# Patient Record
Sex: Male | Born: 1960 | Hispanic: Yes | Marital: Single | Smoking: Never smoker
Health system: Southern US, Community
[De-identification: ages and names within clinical notes are randomized; demographics above are authoritative.]

## PROBLEM LIST (undated history)

## (undated) DIAGNOSIS — R739 Hyperglycemia, unspecified: Secondary | ICD-10-CM

---

## 2015-02-22 ENCOUNTER — Emergency Department (HOSPITAL_BASED_OUTPATIENT_CLINIC_OR_DEPARTMENT_OTHER)
Admission: EM | Admit: 2015-02-22 | Discharge: 2015-02-22 | Disposition: A | Discharge status: Home / Self Care | Payer: PRIVATE HEALTH INSURANCE | Attending: Emergency Medicine | Admitting: Emergency Medicine

## 2015-02-22 ENCOUNTER — Encounter (HOSPITAL_BASED_OUTPATIENT_CLINIC_OR_DEPARTMENT_OTHER): Payer: Self-pay | Admitting: *Deleted

## 2015-02-22 DIAGNOSIS — R5381 Other malaise: Secondary | ICD-10-CM | POA: Diagnosis not present

## 2015-02-22 DIAGNOSIS — R531 Weakness: Secondary | ICD-10-CM | POA: Insufficient documentation

## 2015-02-22 DIAGNOSIS — R42 Dizziness and giddiness: Secondary | ICD-10-CM | POA: Insufficient documentation

## 2015-02-22 HISTORY — DX: Hyperglycemia, unspecified: R73.9

## 2015-02-22 LAB — CBC WITH DIFFERENTIAL/PLATELET
Basophils Absolute: 0 10*3/uL (ref 0.0–0.1)
Basophils Relative: 0 % (ref 0–1)
EOS PCT: 8 % — AB (ref 0–5)
Eosinophils Absolute: 0.5 10*3/uL (ref 0.0–0.7)
HCT: 48.9 % (ref 39.0–52.0)
Hemoglobin: 16.8 g/dL (ref 13.0–17.0)
LYMPHS PCT: 37 % (ref 12–46)
Lymphs Abs: 2.3 10*3/uL (ref 0.7–4.0)
MCH: 31.9 pg (ref 26.0–34.0)
MCHC: 34.4 g/dL (ref 30.0–36.0)
MCV: 92.8 fL (ref 78.0–100.0)
Monocytes Absolute: 0.5 10*3/uL (ref 0.1–1.0)
Monocytes Relative: 8 % (ref 3–12)
NEUTROS PCT: 47 % (ref 43–77)
Neutro Abs: 3 10*3/uL (ref 1.7–7.7)
Platelets: 188 10*3/uL (ref 150–400)
RBC: 5.27 MIL/uL (ref 4.22–5.81)
RDW: 12.1 % (ref 11.5–15.5)
WBC: 6.4 10*3/uL (ref 4.0–10.5)

## 2015-02-22 LAB — URINALYSIS, ROUTINE W REFLEX MICROSCOPIC
BILIRUBIN URINE: NEGATIVE
GLUCOSE, UA: NEGATIVE mg/dL
Hgb urine dipstick: NEGATIVE
Ketones, ur: NEGATIVE mg/dL
LEUKOCYTES UA: NEGATIVE
Nitrite: NEGATIVE
PROTEIN: NEGATIVE mg/dL
Specific Gravity, Urine: 1.027 (ref 1.005–1.030)
Urobilinogen, UA: 0.2 mg/dL (ref 0.0–1.0)
pH: 5.5 (ref 5.0–8.0)

## 2015-02-22 LAB — BASIC METABOLIC PANEL
Anion gap: 7 (ref 5–15)
BUN: 22 mg/dL (ref 6–23)
CALCIUM: 8.8 mg/dL (ref 8.4–10.5)
CO2: 26 mmol/L (ref 19–32)
Chloride: 105 mmol/L (ref 96–112)
Creatinine, Ser: 1.1 mg/dL (ref 0.50–1.35)
GFR calc non Af Amer: 76 mL/min — ABNORMAL LOW (ref >90)
GFR, EST AFRICAN AMERICAN: 88 mL/min — AB (ref >90)
GLUCOSE: 113 mg/dL — AB (ref 70–99)
POTASSIUM: 3.7 mmol/L (ref 3.5–5.1)
Sodium: 138 mmol/L (ref 135–145)

## 2015-02-22 LAB — CBG MONITORING, ED: Glucose-Capillary: 104 mg/dL — ABNORMAL HIGH (ref 70–99)

## 2015-02-22 NOTE — ED Notes (Signed)
Some language barrier. Speaks English. Primary language is Spanish. C/o general weakness. Onset this morning at 0500, woke with "feeling hot, dizzy and breathing fast, which abated and resulted in general weakness", "just weak now". No h/o same. No recent illness. No meds PTA. (Denies: pain, nvd, fever or other sx). Alert, NAD, calm, interactive, resps e/u, no dyspnea noted. VSS.

## 2015-02-22 NOTE — Discharge Instructions (Signed)
Drink plain fluids and get plenty of rest.  Return to the emergency department if you develop high fever, chest pain, difficulty breathing, or other new and concerning symptoms.   Fatigue Fatigue is a feeling of tiredness, lack of energy, lack of motivation, or feeling tired all the time. Having enough rest, good nutrition, and reducing stress will normally reduce fatigue. Consult your caregiver if it persists. The nature of your fatigue will help your caregiver to find out its cause. The treatment is based on the cause.  CAUSES  There are many causes for fatigue. Most of the time, fatigue can be traced to one or more of your habits or routines. Most causes fit into one or more of three general areas. They are: Lifestyle problems  Sleep disturbances.  Overwork.  Physical exertion.  Unhealthy habits.  Poor eating habits or eating disorders.  Alcohol and/or drug use .  Lack of proper nutrition (malnutrition). Psychological problems  Stress and/or anxiety problems.  Depression.  Grief.  Boredom. Medical Problems or Conditions  Anemia.  Pregnancy.  Thyroid gland problems.  Recovery from major surgery.  Continuous pain.  Emphysema or asthma that is not well controlled  Allergic conditions.  Diabetes.  Infections (such as mononucleosis).  Obesity.  Sleep disorders, such as sleep apnea.  Heart failure or other heart-related problems.  Cancer.  Kidney disease.  Liver disease.  Effects of certain medicines such as antihistamines, cough and cold remedies, prescription pain medicines, heart and blood pressure medicines, drugs used for treatment of cancer, and some antidepressants. SYMPTOMS  The symptoms of fatigue include:   Lack of energy.  Lack of drive (motivation).  Drowsiness.  Feeling of indifference to the surroundings. DIAGNOSIS  The details of how you feel help guide your caregiver in finding out what is causing the fatigue. You will be asked  about your present and past health condition. It is important to review all medicines that you take, including prescription and non-prescription items. A thorough exam will be done. You will be questioned about your feelings, habits, and normal lifestyle. Your caregiver may suggest blood tests, urine tests, or other tests to look for common medical causes of fatigue.  TREATMENT  Fatigue is treated by correcting the underlying cause. For example, if you have continuous pain or depression, treating these causes will improve how you feel. Similarly, adjusting the dose of certain medicines will help in reducing fatigue.  HOME CARE INSTRUCTIONS   Try to get the required amount of good sleep every night.  Eat a healthy and nutritious diet, and drink enough water throughout the day.  Practice ways of relaxing (including yoga or meditation).  Exercise regularly.  Make plans to change situations that cause stress. Act on those plans so that stresses decrease over time. Keep your work and personal routine reasonable.  Avoid street drugs and minimize use of alcohol.  Start taking a daily multivitamin after consulting your caregiver. SEEK MEDICAL CARE IF:   You have persistent tiredness, which cannot be accounted for.  You have fever.  You have unintentional weight loss.  You have headaches.  You have disturbed sleep throughout the night.  You are feeling sad.  You have constipation.  You have dry skin.  You have gained weight.  You are taking any new or different medicines that you suspect are causing fatigue.  You are unable to sleep at night.  You develop any unusual swelling of your legs or other parts of your body. SEEK IMMEDIATE MEDICAL CARE  IF:   You are feeling confused.  Your vision is blurred.  You feel faint or pass out.  You develop severe headache.  You develop severe abdominal, pelvic, or back pain.  You develop chest pain, shortness of breath, or an irregular  or fast heartbeat.  You are unable to pass a normal amount of urine.  You develop abnormal bleeding such as bleeding from the rectum or you vomit blood.  You have thoughts about harming yourself or committing suicide.  You are worried that you might harm someone else. MAKE SURE YOU:   Understand these instructions.  Will watch your condition.  Will get help right away if you are not doing well or get worse. Document Released: 08/28/2007 Document Revised: 01/23/2012 Document Reviewed: 03/04/2014 [facility] Patient Information 2015 [place], [place]. This information is not intended to replace advice given to you by your health care provider. Make sure you discuss any questions you have with your health care provider.

## 2015-02-22 NOTE — ED Provider Notes (Signed)
CSN: [id number]     Arrival date & time 02/22/15  0602 History   First MD Initiated Contact with Patient 02/22/15 [phone]     Chief Complaint  Patient presents with  . Weakness     (Consider location/radiation/quality/duration/timing/severity/associated sxs/prior Treatment) HPI Comments: Patient is a 54 year old male who presents for evaluation of weakness, dizziness, and generalized malaise which woke him from sleep this morning. He went to bed last night feeling fine. The symptoms lasted for approximately one hour, then he presents here for evaluation. He denies any chest pain, shortness of breath, palpitations. He denies any fevers or chills. He denies any nausea, vomiting, or diarrhea. He denies any congestion or cough.  Patient is a 54 y.o. male presenting with weakness. The history is provided by the patient.  Weakness This is a new problem. The current episode started 1 to 2 hours ago. The problem occurs constantly. The problem has been gradually improving. Pertinent negatives include no chest pain, no abdominal pain, no headaches and no shortness of breath. Nothing aggravates the symptoms. Nothing relieves the symptoms. He has tried nothing for the symptoms. The treatment provided no relief.    Past Medical History  Diagnosis Date  . Hyperglycemia     "distant, 3 yrs ago", denies DM   History reviewed. No pertinent past surgical history. History reviewed. No pertinent family history. History  Substance Use Topics  . Smoking status: Never Smoker   . Smokeless tobacco: Not on file  . Alcohol Use: No    Review of Systems  Respiratory: Negative for shortness of breath.   Cardiovascular: Negative for chest pain.  Gastrointestinal: Negative for abdominal pain.  Neurological: Positive for weakness. Negative for headaches.  All other systems reviewed and are negative.     Allergies  Review of patient's allergies indicates no known allergies.  Home Medications   Prior to  Admission medications   Not on File   BP 133/81 mmHg  Pulse 74  Temp(Src) 98 F (36.7 C) (Oral)  Resp 16  Ht  (1.727 m)  Wt 167 lb (75.751 kg)  BMI 25.40 kg/m2  SpO2 100% Physical Exam  Constitutional: He is oriented to person, place, and time. He appears well-developed and well-nourished.  HENT:  Head: Normocephalic and atraumatic.  Mouth/Throat: Oropharynx is clear and moist.  Eyes: EOM are normal. Pupils are equal, round, and reactive to light.  Neck: Normal range of motion. Neck supple.  Cardiovascular: Normal rate, regular rhythm and normal heart sounds.   No murmur heard. Pulmonary/Chest: Effort normal. No respiratory distress. He has no wheezes.  Abdominal: Soft. Bowel sounds are normal. He exhibits no distension. There is no tenderness.  Musculoskeletal: Normal range of motion. He exhibits no edema.  Lymphadenopathy:    He has no cervical adenopathy.  Neurological: He is alert and oriented to person, place, and time. No cranial nerve deficit. He exhibits normal muscle tone. Coordination normal.  Skin: Skin is warm and dry. He is not diaphoretic.  Nursing note and vitals reviewed.   ED Course  Procedures (including critical care time) Labs Review Labs Reviewed  CBC WITH DIFFERENTIAL/PLATELET - Abnormal; Notable for the following:    Eosinophils Relative 8 (*)    All other components within normal limits  BASIC METABOLIC PANEL - Abnormal; Notable for the following:    Glucose, Bld 113 (*)    GFR calc non Af Amer 76 (*)    GFR calc Af Amer 88 (*)    All other components  within normal limits  CBG MONITORING, ED - Abnormal; Notable for the following:    Glucose-Capillary 104 (*)    All other components within normal limits  URINALYSIS, ROUTINE W REFLEX MICROSCOPIC    Imaging Review No results found.  ED ECG REPORT   Date: 02/22/2015  Rate: 69  Rhythm: normal sinus rhythm  QRS Axis: normal  Intervals: normal  ST/T Wave abnormalities: normal   Conduction Disutrbances:none  Narrative Interpretation:   Old EKG Reviewed: none available  I have personally reviewed the EKG tracing and agree with the computerized printout as noted.   MDM   Final diagnoses:  None    Patient presents for evaluation of feeling weak. This started this morning approximately an hour before coming here. He woke up this way. His physical examination is unremarkable, vitals are stable, and workup reveals no abnormalities in his electrolytes or urinalysis. His EKG reveals a normal sinus rhythm. I am uncertain as to the exact etiology of his symptoms, however this may be some sort of viral prodrome. I do not feel as though there is any indication for admission and believe he is appropriate for discharge. He does state that he is feeling better, however does feel somewhat weak. He will be discharged with instructions to return if his symptoms significantly worsen or change.    Geoffery Lyonsouglas Maddy Graham, MD 02/22/15 [phone]

## 2015-02-22 NOTE — ED Notes (Signed)
Pt ambulatory to b/r to attempt urine sample. Pt alert, NAD, calm, interactive, resps e/u, speaking in clear complete sentences, steady gait. roommate in w/r.

## 2015-03-20 ENCOUNTER — Telehealth: Payer: Self-pay | Admitting: *Deleted

## 2015-03-20 NOTE — Telephone Encounter (Signed)
Unable to reach patient at time of Pre-Visit Call.  Unable to leave voicemail due to voicemail box not being set up yet.

## 2015-03-23 ENCOUNTER — Encounter: Payer: Self-pay | Admitting: Physician Assistant

## 2015-03-23 ENCOUNTER — Ambulatory Visit (INDEPENDENT_AMBULATORY_CARE_PROVIDER_SITE_OTHER): Payer: PRIVATE HEALTH INSURANCE | Admitting: Physician Assistant

## 2015-03-23 VITALS — BP 102/64 | HR 89 | Temp 98.0°F | Ht 67.0 in | Wt 163.0 lb

## 2015-03-23 DIAGNOSIS — R42 Dizziness and giddiness: Secondary | ICD-10-CM

## 2015-03-23 LAB — URINALYSIS, ROUTINE W REFLEX MICROSCOPIC
BILIRUBIN URINE: NEGATIVE
HGB URINE DIPSTICK: NEGATIVE
KETONES UR: NEGATIVE
Leukocytes, UA: NEGATIVE
Nitrite: NEGATIVE
Specific Gravity, Urine: 1.025 (ref 1.000–1.030)
TOTAL PROTEIN, URINE-UPE24: NEGATIVE
URINE GLUCOSE: NEGATIVE
Urobilinogen, UA: 0.2 (ref 0.0–1.0)
pH: 6 (ref 5.0–8.0)

## 2015-03-23 LAB — CBC
HCT: 48.3 % (ref 39.0–52.0)
HEMOGLOBIN: 16.9 g/dL (ref 13.0–17.0)
MCHC: 35 g/dL (ref 30.0–36.0)
MCV: 92.2 fl (ref 78.0–100.0)
Platelets: 203 10*3/uL (ref 150.0–400.0)
RBC: 5.23 Mil/uL (ref 4.22–5.81)
RDW: 12.5 % (ref 11.5–15.5)
WBC: 6.6 10*3/uL (ref 4.0–10.5)

## 2015-03-23 LAB — COMPREHENSIVE METABOLIC PANEL
ALBUMIN: 4.1 g/dL (ref 3.5–5.2)
ALK PHOS: 115 U/L (ref 39–117)
ALT: 46 U/L (ref 0–53)
AST: 31 U/L (ref 0–37)
BUN: 18 mg/dL (ref 6–23)
CHLORIDE: 105 meq/L (ref 96–112)
CO2: 28 mEq/L (ref 19–32)
Calcium: 9.5 mg/dL (ref 8.4–10.5)
Creatinine, Ser: 0.99 mg/dL (ref 0.40–1.50)
GFR: 84.51 mL/min (ref >60.00)
GLUCOSE: 91 mg/dL (ref 70–99)
Potassium: 4.1 mEq/L (ref 3.5–5.1)
Sodium: 138 mEq/L (ref 135–145)
Total Bilirubin: 0.7 mg/dL (ref 0.2–1.2)
Total Protein: 7.4 g/dL (ref 6.0–8.3)

## 2015-03-23 LAB — TSH: TSH: 1.9 u[IU]/mL (ref 0.35–4.50)

## 2015-03-23 NOTE — Assessment & Plan Note (Signed)
Suspect due to mild dehydration and hot work environment.  Exam good. OVS negative but BP remains at low end of normal.  Will repeat CBC, CMP and obtain TSH and UA. Hydration efforts discussed with patient.  Will also start daily Gatorade.  Follow-up in 1.5-2 weeks.  Alarm signs/symptoms discussed with patient.

## 2015-03-23 NOTE — Progress Notes (Signed)
Pre visit review using our clinic review tool, if applicable. No additional management support is needed unless otherwise documented below in the visit note. 

## 2015-03-23 NOTE — Addendum Note (Signed)
Addended by: Marcelline MatesMARTIN, Ezzie Senat on: 03/23/2015 08:53 AM   Modules accepted: Orders

## 2015-03-23 NOTE — Progress Notes (Signed)
Patient presents to clinic today to establish care and for ER follow-up. Records from ER visit are in the EMR and have been reviewed. Translator present for entirety of visit.  Patient seen in ER on 02/22/15 for an episode of dizziness that occurred upon waking that was associated with fatigue. Workup including EKG and lab panel negative for cause of symptoms.  ER physician felt that symptoms could potentially be related to a viral prodrome. Since ER visit, patient endorses continued episodes of lightheadedness.  Denies true vertigo or dizziness.  States episodes last around 1 hour. Denies chest pain, SOB or palpitations.  Denies nausea or vomiting.  Endorses mild fatigue. Symptoms only occur when standing after prolonged sitting or lying.  States he eats well but only drinks a glass of water during his long work shift.  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  Past Medical History  Diagnosis Date  . Hyperglycemia     "distant, 3 yrs ago", denies DM    No past surgical history on file.  No current outpatient prescriptions on file prior to visit.   No current facility-administered medications on file prior to visit.    No Known Allergies  No family history on file.  History   Social History  . Marital Status: Single    Spouse Name: N/A  . Number of Children: N/A  . Years of Education: N/A   Occupational History  . Not on file.   Social History Main Topics  . Smoking status: Never Smoker   . Smokeless tobacco: Not on file  . Alcohol Use: No  . Drug Use: No  . Sexual Activity: Not on file   Other Topics Concern  . Not on file   Social History Narrative   Review of Systems  Constitutional: Negative for fever and weight loss.  Eyes: Negative for blurred vision.  Respiratory: Negative for sputum production.   Cardiovascular: Negative for chest pain and palpitations.  Gastrointestinal: Negative for nausea and vomiting.  Neurological: Negative for dizziness, loss  of consciousness and headaches.  Psychiatric/Behavioral: Negative for depression, suicidal ideas, hallucinations and substance abuse. The patient is not nervous/anxious.     BP 102/64 mmHg  Pulse 89  Temp(Src) 98 F (36.7 C)  Ht '5\' 7"'  (1.702 m)  Wt 163 lb (73.936 kg)  BMI 25.52 kg/m2  SpO2 98%  Physical Exam  Constitutional: He is oriented to person, place, and time and well-developed, well-nourished, and in no distress.  HENT:  Head: Normocephalic and atraumatic.  Right Ear: External ear normal.  Left Ear: External ear normal.  Nose: Nose normal.  Mouth/Throat: Oropharynx is clear and moist. No oropharyngeal exudate.  TM within normal limits bilaterally.  Eyes: Conjunctivae are normal. Pupils are equal, round, and reactive to light.  Neck: Neck supple. No thyromegaly present.  Cardiovascular: Normal rate, regular rhythm, normal heart sounds and intact distal pulses.   Pulmonary/Chest: Effort normal and breath sounds normal. No respiratory distress. He has no wheezes. He has no rales. He exhibits no tenderness.  Neurological: He is alert and oriented to person, place, and time.  Skin: Skin is warm and dry. No rash noted.  Psychiatric: Affect normal.  Vitals reviewed.   Recent Results (from the past 2160 hour(s))  POC CBG, ED     Status: Abnormal   Collection Time: 02/22/15  6:34 AM  Result Value Ref Range   Glucose-Capillary 104 (H) 70 - 99 mg/dL  CBC with Differential  Status: Abnormal   Collection Time: 02/22/15  6:40 AM  Result Value Ref Range   WBC 6.4 4.0 - 10.5 K/uL   RBC 5.27 4.22 - 5.81 MIL/uL   Hemoglobin 16.8 13.0 - 17.0 g/dL   HCT 48.9 39.0 - 52.0 %   MCV 92.8 78.0 - 100.0 fL   MCH 31.9 26.0 - 34.0 pg   MCHC 34.4 30.0 - 36.0 g/dL   RDW 12.1 11.5 - 15.5 %   Platelets 188 150 - 400 K/uL   Neutrophils Relative % 47 43 - 77 %   Neutro Abs 3.0 1.7 - 7.7 K/uL   Lymphocytes Relative 37 12 - 46 %   Lymphs Abs 2.3 0.7 - 4.0 K/uL   Monocytes Relative 8 3 - 12  %   Monocytes Absolute 0.5 0.1 - 1.0 K/uL   Eosinophils Relative 8 (H) 0 - 5 %   Eosinophils Absolute 0.5 0.0 - 0.7 K/uL   Basophils Relative 0 0 - 1 %   Basophils Absolute 0.0 0.0 - 0.1 K/uL  Basic metabolic panel     Status: Abnormal   Collection Time: 02/22/15  6:40 AM  Result Value Ref Range   Sodium 138 135 - 145 mmol/L   Potassium 3.7 3.5 - 5.1 mmol/L   Chloride 105 96 - 112 mmol/L   CO2 26 19 - 32 mmol/L   Glucose, Bld 113 (H) 70 - 99 mg/dL   BUN 22 6 - 23 mg/dL   Creatinine, Ser 1.10 0.50 - 1.35 mg/dL   Calcium 8.8 8.4 - 10.5 mg/dL   GFR calc non Af Amer 76 (L) >90 mL/min   GFR calc Af Amer 88 (L) >90 mL/min    Comment: (NOTE) The eGFR has been calculated using the CKD EPI equation. This calculation has not been validated in all clinical situations. eGFR's persistently <90 mL/min signify possible Chronic Kidney Disease.    Anion gap 7 5 - 15  Urinalysis, Routine w reflex microscopic     Status: None   Collection Time: 02/22/15  6:51 AM  Result Value Ref Range   Color, Urine YELLOW YELLOW   APPearance CLEAR CLEAR   Specific Gravity, Urine 1.027 1.005 - 1.030   pH 5.5 5.0 - 8.0   Glucose, UA NEGATIVE NEGATIVE mg/dL   Hgb urine dipstick NEGATIVE NEGATIVE   Bilirubin Urine NEGATIVE NEGATIVE   Ketones, ur NEGATIVE NEGATIVE mg/dL   Protein, ur NEGATIVE NEGATIVE mg/dL   Urobilinogen, UA 0.2 0.0 - 1.0 mg/dL   Nitrite NEGATIVE NEGATIVE   Leukocytes, UA NEGATIVE NEGATIVE    Comment: MICROSCOPIC NOT DONE ON URINES WITH NEGATIVE PROTEIN, BLOOD, LEUKOCYTES, NITRITE, OR GLUCOSE <1000 mg/dL.    Assessment/Plan: Episodic lightheadedness Suspect due to mild dehydration and hot work environment.  Exam good. OVS negative but BP remains at low end of normal.  Will repeat CBC, CMP and obtain TSH and UA. Hydration efforts discussed with patient.  Will also start daily Gatorade.  Follow-up in 1.5-2 weeks.  Alarm signs/symptoms discussed with patient.

## 2015-03-23 NOTE — Patient Instructions (Signed)
Please go to the lab for blood work. I will call you with your results. Please eat a meal or snack every 4 hours. Drink 6 glasses of water per day.  Add 1 gatorade per day.  I feel your symptoms are stemming from mild dehydration which is causing BP to be low.  Follow-up in 2 weeks.

## 2015-04-08 ENCOUNTER — Encounter: Payer: Self-pay | Admitting: Physician Assistant

## 2015-04-08 ENCOUNTER — Ambulatory Visit (INDEPENDENT_AMBULATORY_CARE_PROVIDER_SITE_OTHER): Payer: PRIVATE HEALTH INSURANCE | Admitting: Physician Assistant

## 2015-04-08 VITALS — BP 114/76 | HR 80 | Temp 98.0°F | Ht 67.0 in | Wt 166.0 lb

## 2015-04-08 DIAGNOSIS — R42 Dizziness and giddiness: Secondary | ICD-10-CM

## 2015-04-08 NOTE — Progress Notes (Signed)
Patient presents to clinic today for follow-up of episodic lightheadedness.  Patient with negative workup at last visit.  Endorses increasing hydration and is no longer skipping meals.  Is drinking a gatorade daily.  States symptoms have mostly resolved but still having some episodes that always occur when going from sitting to standing.  Past Medical History  Diagnosis Date  . Hyperglycemia     "distant, 3 yrs ago", denies DM    No current outpatient prescriptions on file prior to visit.   No current facility-administered medications on file prior to visit.    No Known Allergies  No family history on file.  History   Social History  . Marital Status: Single    Spouse Name: N/A  . Number of Children: N/A  . Years of Education: N/A   Social History Main Topics  . Smoking status: Never Smoker   . Smokeless tobacco: Not on file  . Alcohol Use: No  . Drug Use: No  . Sexual Activity: Not on file   Other Topics Concern  . None   Social History Narrative    Review of Systems - See HPI.  All other ROS are negative.  BP 114/76 mmHg  Pulse 80  Temp(Src) 98 F (36.7 C) (Oral)  Ht '5\' 7"'  (1.702 m)  Wt 166 lb (75.297 kg)  BMI 25.99 kg/m2  SpO2 98%  Physical Exam  Constitutional: He is oriented to person, place, and time and well-developed, well-nourished, and in no distress.  HENT:  Head: Normocephalic and atraumatic.  Eyes: Conjunctivae are normal.  Cardiovascular: Normal rate, regular rhythm, normal heart sounds and intact distal pulses.   Pulmonary/Chest: Effort normal and breath sounds normal. No respiratory distress. He has no wheezes. He has no rales. He exhibits no tenderness.  Neurological: He is alert and oriented to person, place, and time.  Skin: Skin is warm and dry. No rash noted.  Psychiatric: Affect normal.  Vitals reviewed.   Recent Results (from the past 2160 hour(s))  POC CBG, ED     Status: Abnormal   Collection Time: 02/22/15  6:34 AM  Result  Value Ref Range   Glucose-Capillary 104 (H) 70 - 99 mg/dL  CBC with Differential     Status: Abnormal   Collection Time: 02/22/15  6:40 AM  Result Value Ref Range   WBC 6.4 4.0 - 10.5 K/uL   RBC 5.27 4.22 - 5.81 MIL/uL   Hemoglobin 16.8 13.0 - 17.0 g/dL   HCT 48.9 39.0 - 52.0 %   MCV 92.8 78.0 - 100.0 fL   MCH 31.9 26.0 - 34.0 pg   MCHC 34.4 30.0 - 36.0 g/dL   RDW 12.1 11.5 - 15.5 %   Platelets 188 150 - 400 K/uL   Neutrophils Relative % 47 43 - 77 %   Neutro Abs 3.0 1.7 - 7.7 K/uL   Lymphocytes Relative 37 12 - 46 %   Lymphs Abs 2.3 0.7 - 4.0 K/uL   Monocytes Relative 8 3 - 12 %   Monocytes Absolute 0.5 0.1 - 1.0 K/uL   Eosinophils Relative 8 (H) 0 - 5 %   Eosinophils Absolute 0.5 0.0 - 0.7 K/uL   Basophils Relative 0 0 - 1 %   Basophils Absolute 0.0 0.0 - 0.1 K/uL  Basic metabolic panel     Status: Abnormal   Collection Time: 02/22/15  6:40 AM  Result Value Ref Range   Sodium 138 135 - 145 mmol/L   Potassium 3.7 3.5 -  5.1 mmol/L   Chloride 105 96 - 112 mmol/L   CO2 26 19 - 32 mmol/L   Glucose, Bld 113 (H) 70 - 99 mg/dL   BUN 22 6 - 23 mg/dL   Creatinine, Ser 1.10 0.50 - 1.35 mg/dL   Calcium 8.8 8.4 - 10.5 mg/dL   GFR calc non Af Amer 76 (L) >90 mL/min   GFR calc Af Amer 88 (L) >90 mL/min    Comment: (NOTE) The eGFR has been calculated using the CKD EPI equation. This calculation has not been validated in all clinical situations. eGFR's persistently <90 mL/min signify possible Chronic Kidney Disease.    Anion gap 7 5 - 15  Urinalysis, Routine w reflex microscopic     Status: None   Collection Time: 02/22/15  6:51 AM  Result Value Ref Range   Color, Urine YELLOW YELLOW   APPearance CLEAR CLEAR   Specific Gravity, Urine 1.027 1.005 - 1.030   pH 5.5 5.0 - 8.0   Glucose, UA NEGATIVE NEGATIVE mg/dL   Hgb urine dipstick NEGATIVE NEGATIVE   Bilirubin Urine NEGATIVE NEGATIVE   Ketones, ur NEGATIVE NEGATIVE mg/dL   Protein, ur NEGATIVE NEGATIVE mg/dL   Urobilinogen, UA  0.2 0.0 - 1.0 mg/dL   Nitrite NEGATIVE NEGATIVE   Leukocytes, UA NEGATIVE NEGATIVE    Comment: MICROSCOPIC NOT DONE ON URINES WITH NEGATIVE PROTEIN, BLOOD, LEUKOCYTES, NITRITE, OR GLUCOSE <1000 mg/dL.  Comp Met (CMET)     Status: None   Collection Time: 03/23/15  8:33 AM  Result Value Ref Range   Sodium 138 135 - 145 mEq/L   Potassium 4.1 3.5 - 5.1 mEq/L   Chloride 105 96 - 112 mEq/L   CO2 28 19 - 32 mEq/L   Glucose, Bld 91 70 - 99 mg/dL   BUN 18 6 - 23 mg/dL   Creatinine, Ser 0.99 0.40 - 1.50 mg/dL   Total Bilirubin 0.7 0.2 - 1.2 mg/dL   Alkaline Phosphatase 115 39 - 117 U/L   AST 31 0 - 37 U/L   ALT 46 0 - 53 U/L   Total Protein 7.4 6.0 - 8.3 g/dL   Albumin 4.1 3.5 - 5.2 g/dL   Calcium 9.5 8.4 - 10.5 mg/dL   GFR 84.51 >60.00 mL/min  CBC     Status: None   Collection Time: 03/23/15  8:33 AM  Result Value Ref Range   WBC 6.6 4.0 - 10.5 K/uL   RBC 5.23 4.22 - 5.81 Mil/uL   Platelets 203.0 150.0 - 400.0 K/uL   Hemoglobin 16.9 13.0 - 17.0 g/dL   HCT 48.3 39.0 - 52.0 %   MCV 92.2 78.0 - 100.0 fl   MCHC 35.0 30.0 - 36.0 g/dL   RDW 12.5 11.5 - 15.5 %  TSH     Status: None   Collection Time: 03/23/15  8:33 AM  Result Value Ref Range   TSH 1.90 0.35 - 4.50 uIU/mL  Urinalysis, Routine w reflex microscopic     Status: Abnormal   Collection Time: 03/23/15  8:33 AM  Result Value Ref Range   Color, Urine YELLOW Yellow;Lt. Yellow   APPearance CLEAR Clear   Specific Gravity, Urine 1.025 1.000-1.030   pH 6.0 5.0 - 8.0   Total Protein, Urine NEGATIVE Negative   Urine Glucose NEGATIVE Negative   Ketones, ur NEGATIVE Negative   Bilirubin Urine NEGATIVE Negative   Hgb urine dipstick NEGATIVE Negative   Urobilinogen, UA 0.2 0.0 - 1.0   Leukocytes, UA NEGATIVE Negative  Nitrite NEGATIVE Negative   WBC, UA 0-2/hpf 0-2/hpf   RBC / HPF 0-2/hpf 0-2/hpf   Mucus, UA Presence of (A) None   Squamous Epithelial / LPF Rare(0-4/hpf) Rare(0-4/hpf)    Assessment/Plan: Episodic  lightheadedness Improved. Orthostatics good overall in clinic although dropping 10 points. Continue hydration and well-balanced diet. Increase to G2 gatorade twice daily.  Rx compression stockings for a two week trial.

## 2015-04-08 NOTE — Patient Instructions (Signed)
I am glad you are feeling better.  The increased hydration and diet are helping.  I think we can resolve your situation if we consider wearing compression stockings while at work to prevent BP from dropping while your are changing positions.

## 2015-04-08 NOTE — Progress Notes (Signed)
Pre visit review using our clinic review tool, if applicable. No additional management support is needed unless otherwise documented below in the visit note. 

## 2015-04-08 NOTE — Assessment & Plan Note (Signed)
Improved. Orthostatics good overall in clinic although dropping 10 points. Continue hydration and well-balanced diet. Increase to G2 gatorade twice daily.  Rx compression stockings for a two week trial.

## 2018-04-16 ENCOUNTER — Encounter (HOSPITAL_BASED_OUTPATIENT_CLINIC_OR_DEPARTMENT_OTHER): Payer: Self-pay | Admitting: Emergency Medicine

## 2018-04-16 ENCOUNTER — Emergency Department (HOSPITAL_BASED_OUTPATIENT_CLINIC_OR_DEPARTMENT_OTHER)
Admission: EM | Admit: 2018-04-16 | Discharge: 2018-04-16 | Disposition: A | Discharge status: Home / Self Care | Payer: BLUE CROSS/BLUE SHIELD | Attending: Emergency Medicine | Admitting: Emergency Medicine

## 2018-04-16 ENCOUNTER — Emergency Department (HOSPITAL_BASED_OUTPATIENT_CLINIC_OR_DEPARTMENT_OTHER): Payer: BLUE CROSS/BLUE SHIELD

## 2018-04-16 ENCOUNTER — Other Ambulatory Visit: Payer: Self-pay

## 2018-04-16 DIAGNOSIS — R002 Palpitations: Secondary | ICD-10-CM | POA: Diagnosis present

## 2018-04-16 DIAGNOSIS — R42 Dizziness and giddiness: Secondary | ICD-10-CM | POA: Diagnosis not present

## 2018-04-16 LAB — BASIC METABOLIC PANEL
Anion gap: 7 (ref 5–15)
BUN: 17 mg/dL (ref 6–20)
CHLORIDE: 104 mmol/L (ref 101–111)
CO2: 27 mmol/L (ref 22–32)
CREATININE: 1.03 mg/dL (ref 0.61–1.24)
Calcium: 8.7 mg/dL — ABNORMAL LOW (ref 8.9–10.3)
GFR calc Af Amer: >60 mL/min (ref >60)
GFR calc non Af Amer: >60 mL/min (ref >60)
Glucose, Bld: 100 mg/dL — ABNORMAL HIGH (ref 65–99)
Potassium: 3.8 mmol/L (ref 3.5–5.1)
SODIUM: 138 mmol/L (ref 135–145)

## 2018-04-16 LAB — CBC
HCT: 45.7 % (ref 39.0–52.0)
Hemoglobin: 16.6 g/dL (ref 13.0–17.0)
MCH: 33.3 pg (ref 26.0–34.0)
MCHC: 36.3 g/dL — AB (ref 30.0–36.0)
MCV: 91.8 fL (ref 78.0–100.0)
Platelets: 210 10*3/uL (ref 150–400)
RBC: 4.98 MIL/uL (ref 4.22–5.81)
RDW: 12.1 % (ref 11.5–15.5)
WBC: 7.5 10*3/uL (ref 4.0–10.5)

## 2018-04-16 LAB — TROPONIN I: Troponin I: <0.03 ng/mL (ref <0.03)

## 2018-04-16 NOTE — Discharge Instructions (Addendum)
Your blood work, EKG and chest x-ray were reassuring today.  Please avoid coffee, caffeinated drinks, over-the-counter cough and cold medicines as this may be predisposing you to an arrhythmia.  Please contact [place] in [place] to let them know about your ER visit today so that you can be seen by a cardiologist for further monitoring.  Return to the ER if you have any new or concerning symptoms like chest pain, trouble breathing, dizziness.

## 2018-04-16 NOTE — ED Triage Notes (Signed)
Patient states that he had an episode of shooting pain from his feet to his head and became acutely dizzy. He states that he still feels dizzy - Patient walks with steady gait, and in no noted distress

## 2018-04-16 NOTE — ED Provider Notes (Signed)
[facility] EMERGENCY DEPARTMENT Provider Note   CSN: [id number] Arrival date & time: 04/16/18  1016     History   Chief Complaint Chief Complaint  Patient presents with  . Dizziness    HPI Antonio Robinson is a 57 y.o. male.  HPI   Antonio Robinson is a 57 year old male with no significant past medical history who presents to the emergency department for evaluation of episode of palpitations, lightheadedness and shortness of breath earlier this morning.  Patient reports that he was sitting talking to a friend around 8:30 AM when he suddenly felt an electric shock type sensation from his bilateral feet all the way up to his head.  Felt as if his heart was racing and beating out of his chest, denies sensation of skipped beats.  States that he felt lightheaded and woozy, "as if I was drunk."  He endorses some mild shortness of breath at the time.  States that his symptoms lasted about 5 minutes and then resolved himself.  States he had a period of time where he felt generalized weakness afterwards. Reports no complaints at this time, feels baseline. Denies headache, visual disturbance, neck pain, numbness, weakness, chest pain, diaphoresis, sob, lightheadedness, dizziness, abdominal pain, nausea/vomiting.   Past Medical History:  Diagnosis Date  . Hyperglycemia    "distant, 3 yrs ago", denies DM    Patient Active Problem List   Diagnosis Date Noted  . Episodic lightheadedness 03/23/2015    History reviewed. No pertinent surgical history.      Home Medications    Prior to Admission medications   Not on File    Family History History reviewed. No pertinent family history.  Social History Social History   Tobacco Use  . Smoking status: Never Smoker  . Smokeless tobacco: Never Used  Substance Use Topics  . Alcohol use: No  . Drug use: No     Allergies   Patient has no known allergies.   Review of Systems Review of Systems  Constitutional: Negative for fever.   HENT: Negative for congestion and trouble swallowing.   Eyes: Negative for visual disturbance.  Respiratory: Positive for shortness of breath (resolved). Negative for cough.   Cardiovascular: Positive for palpitations (resolved). Negative for chest pain.  Gastrointestinal: Negative for abdominal pain, nausea and vomiting.  Genitourinary: Negative for difficulty urinating.  Musculoskeletal: Negative for arthralgias, gait problem and neck pain.  Skin: Negative for rash.  Neurological: Positive for light-headedness (resolved). Negative for weakness, numbness and headaches.  Psychiatric/Behavioral: Negative for agitation.     Physical Exam Updated Vital Signs BP (!) 135/92   Pulse 61   Temp 98.3 F (36.8 C) (Oral)   Resp (!) 21   Ht 5\' 8"  (1.727 m)   Wt 74.4 kg (164 lb)   SpO2 99%   BMI 24.94 kg/m   Physical Exam  Constitutional: He is oriented to person, place, and time. He appears well-developed and well-nourished. No distress.  No acute distress, sitting comfortably at bedside.   HENT:  Head: Normocephalic and atraumatic.  Mouth/Throat: Oropharynx is clear and moist. No oropharyngeal exudate.  Eyes: Pupils are equal, round, and reactive to light. Conjunctivae are normal. Right eye exhibits no discharge. Left eye exhibits no discharge.  Neck: Normal range of motion. Neck supple.  Cardiovascular: Normal rate, regular rhythm and intact distal pulses.  No murmur heard. Pulmonary/Chest: Effort normal and breath sounds normal. No stridor. No respiratory distress. He has no wheezes. He has no rales.  Abdominal: Soft.  Bowel sounds are normal. There is no tenderness.  Neurological: He is alert and oriented to person, place, and time. Coordination normal.  Mental Status:  Alert, oriented, thought content appropriate, able to give a coherent history. Speech fluent without evidence of aphasia. Able to follow 2 step commands without difficulty.  Cranial Nerves:  II:  Peripheral visual  fields grossly normal, pupils equal, round, reactive to light III,IV, VI: ptosis not present, extra-ocular motions intact bilaterally  V,VII: smile symmetric, facial light touch sensation equal VIII: hearing grossly normal to voice  X: uvula elevates symmetrically  XI: bilateral shoulder shrug symmetric and strong XII: midline tongue extension without fassiculations Motor:  Normal tone. 5/5 in upper and lower extremities bilaterally including strong and equal grip strength and dorsiflexion/plantar flexion Sensory: Pinprick and light touch normal in all extremities.  Cerebellar: normal finger-to-nose with bilateral upper extremities Gait: normal gait and balance  Skin: Skin is warm and dry. He is not diaphoretic.  Psychiatric: He has a normal mood and affect. His behavior is normal.  Nursing note and vitals reviewed.    ED Treatments / Results  Labs (all labs ordered are listed, but only abnormal results are displayed) Labs Reviewed  CBC - Abnormal; Notable for the following components:      Result Value   MCHC 36.3 (*)    All other components within normal limits  BASIC METABOLIC PANEL - Abnormal; Notable for the following components:   Glucose, Bld 100 (*)    Calcium 8.7 (*)    All other components within normal limits  TROPONIN I    EKG EKG Interpretation  Date/Time:  Monday April 16 2018 10:31:29 EDT Ventricular Rate:  74 PR Interval:    QRS Duration: 104 QT Interval:  388 QTC Calculation: 431 R Axis:   16 Text Interpretation:  Sinus rhythm RSR' in V1 or V2, right VCD or RVH ST elev, probable normal early repol pattern when compared to prior, no significant changes seen.  No STEMI Confirmed by Theda Belfast ([id number]) on 04/16/2018 10:37:15 AM   Radiology Dg Chest 2 View  Result Date: 04/16/2018 CLINICAL DATA:  Dizziness and palpitations today. EXAM: CHEST - 2 VIEW COMPARISON:  None in PACs FINDINGS: The lungs are adequately inflated. There is no focal infiltrate. There  is no pleural effusion. The heart and pulmonary vascularity are normal. The mediastinum is normal in width. The bony thorax is unremarkable. IMPRESSION: There is no pneumonia nor other acute cardiopulmonary abnormality. Electronically Signed   By: David  [place] M.D.   On: 04/16/2018 11:28    Procedures Procedures (including critical care time)  Medications Ordered in ED Medications - No data to display   Initial Impression / Assessment and Plan / ED Course  I have reviewed the triage vital signs and the nursing notes.  Pertinent labs & imaging results that were available during my care of the patient were reviewed by me and considered in my medical decision making (see chart for details).    Patient presents with presumptive arrhythmia which lasted about five minutes this morning. Denies associated chest pain. Reports he is at baseline and has no complaints currently. His symptoms were likely provoked by coffee he drank this morning, which he reports he does not normally drink. Electrolytes normal. EKG NSR and patient's rhythm remained sinus while in the department. CBC WNL. CXR without acute abnormality, no cardiomegaly. Troponin negative, doubt ACS given presentation. Plan to discharge patient with instructions to follow up with Cardiology for further management.  Counseled him to avoid coffee, caffeinated beverages and OTC cough/cold medicine. He is from [place] originally, states that he will follow up with his PCP when he returns in two days. I think this is appropriate. Discussed reasons to return to the ER and patient agrees and voices understanding and appears reliable for follow up. Discussed this patient with Dr. Rush Landmarkegeler who agrees with plan and d/c home with cardiology follow up.   Final Clinical Impressions(s) / ED Diagnoses   Final diagnoses:  Palpitations    ED Discharge Orders    None       Lawrence MarseillesShrosbree, Emily J, PA-C 04/16/18 2002    Tegeler, Canary Brimhristopher J, MD 04/17/18  [phone]

## 2018-10-17 IMAGING — DX DG CHEST 2V
2 series · 2 of 2 positions shown · non-contrast
Comparison: None in PACs

CLINICAL DATA: Dizziness and palpitations today.

EXAM:
CHEST - 2 VIEW

[chest pa]
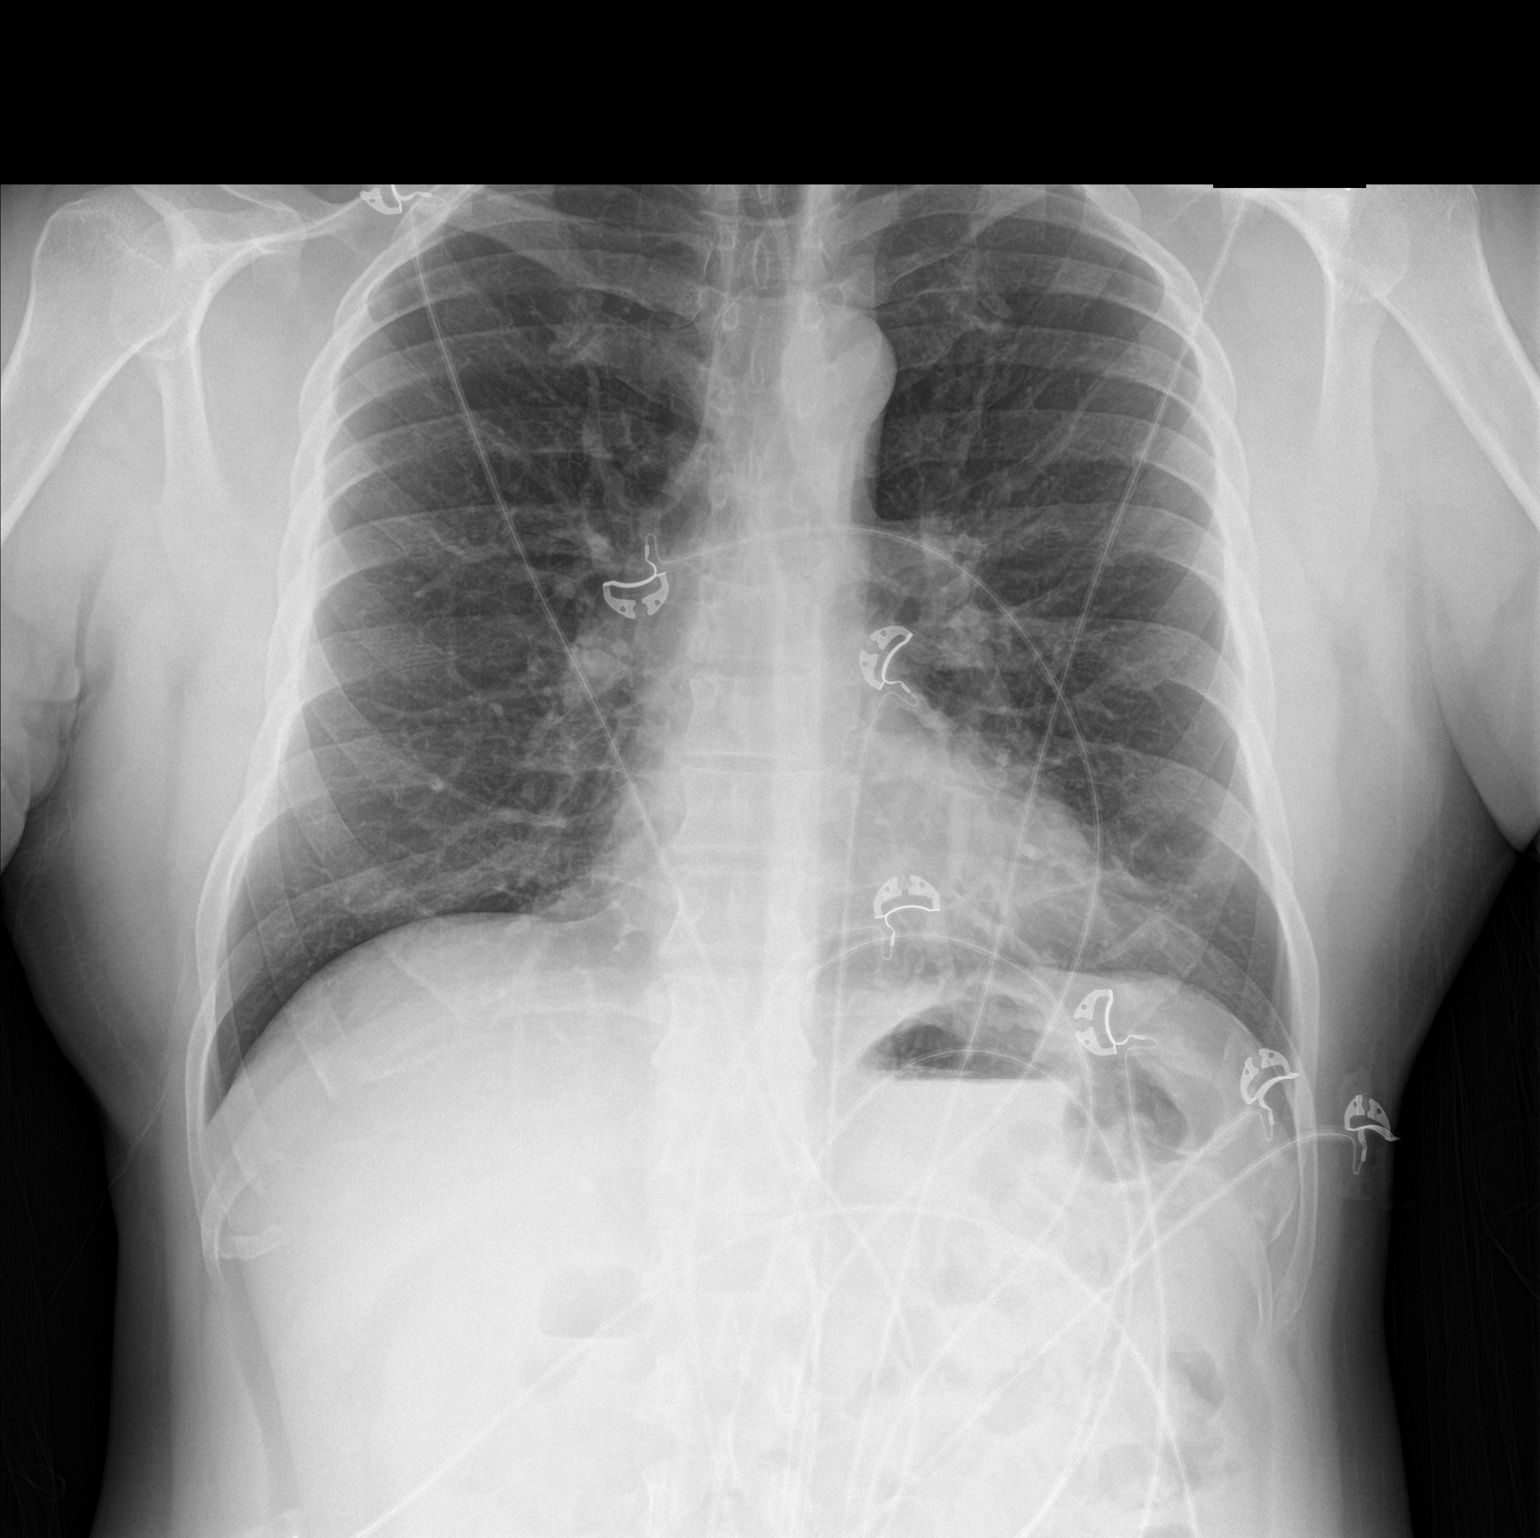

[chest lat]
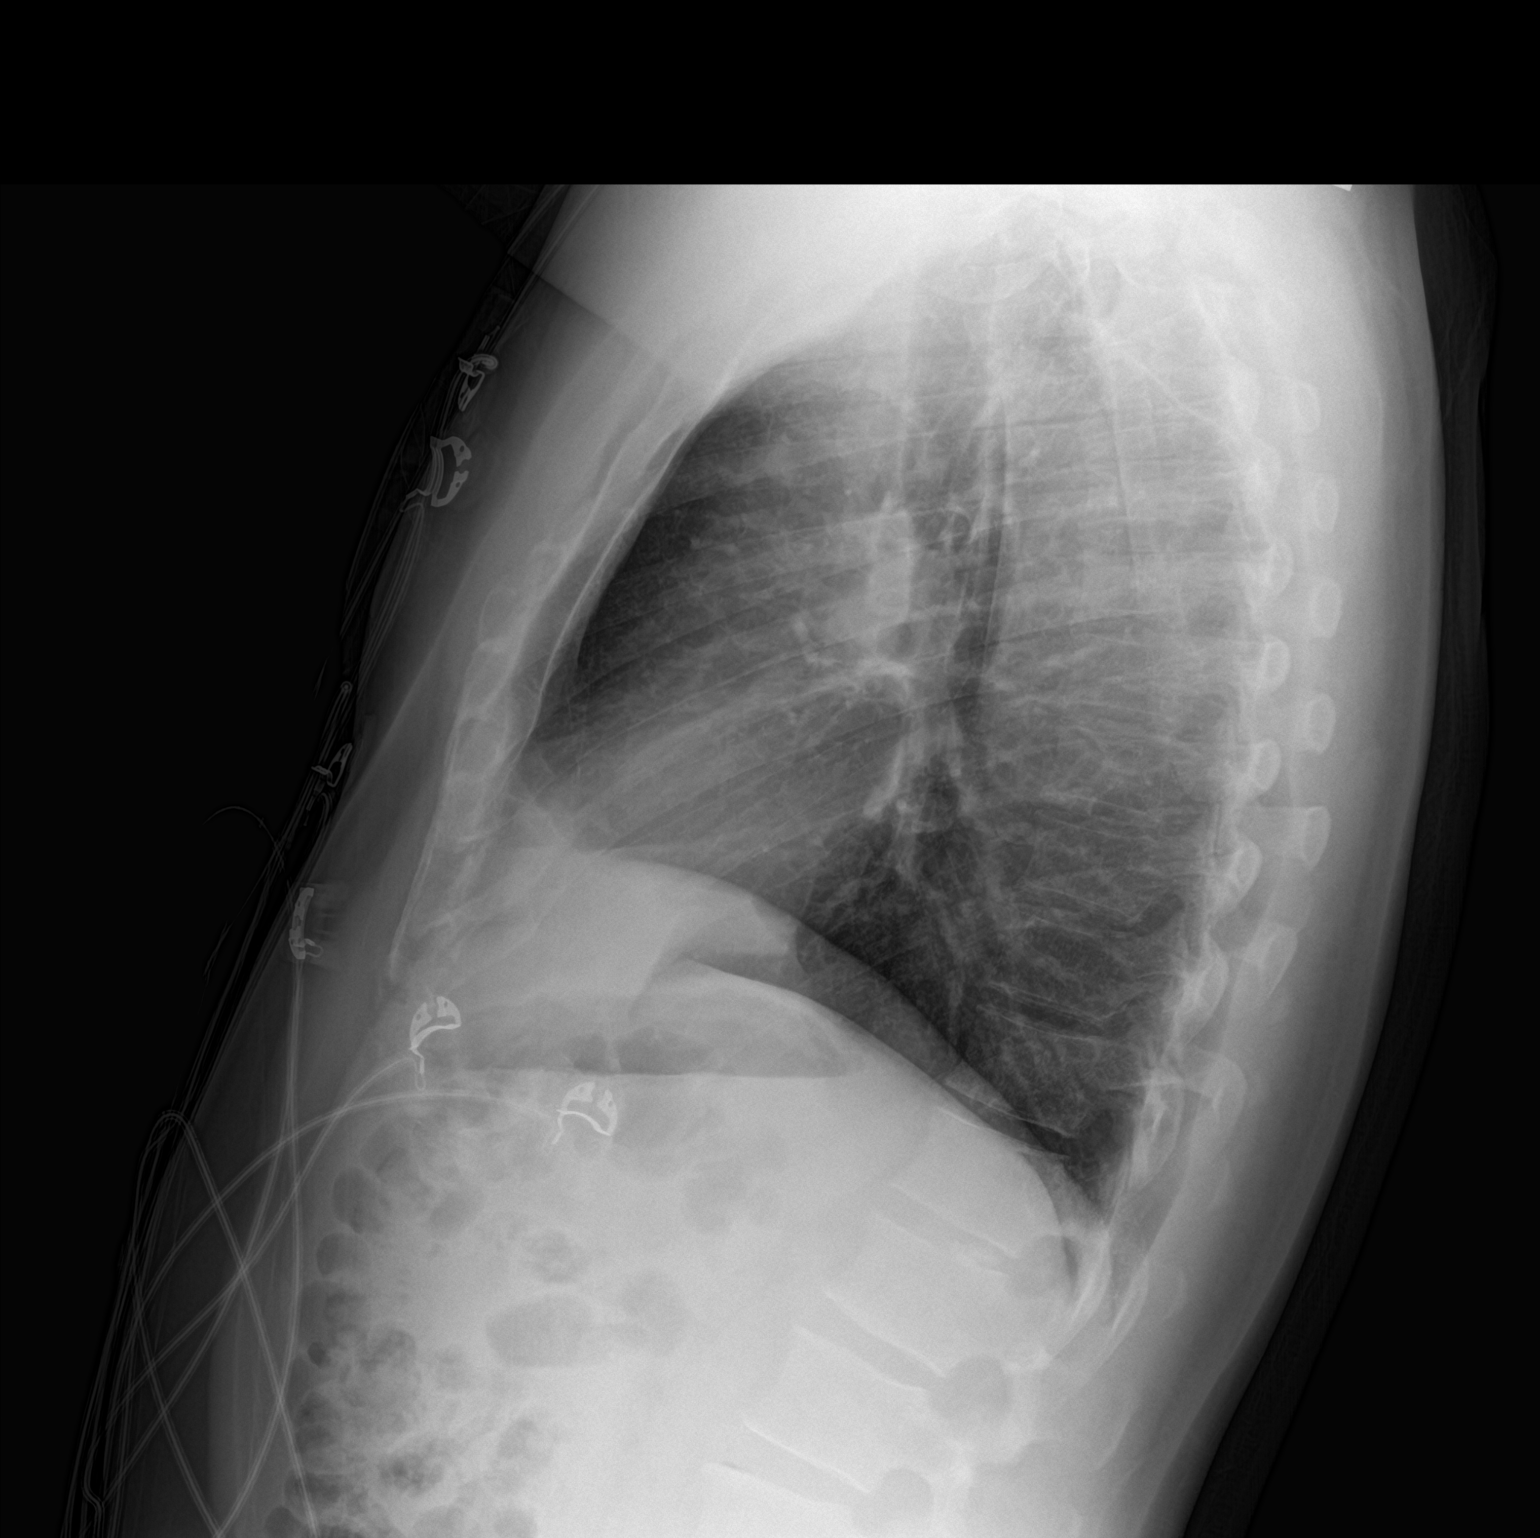

[2 of 2 positions shown; findings below may reference images not displayed]

FINDINGS: The lungs are adequately inflated. There is no focal infiltrate.
There is no pleural effusion. The heart and pulmonary vascularity
are normal. The mediastinum is normal in width. The bony thorax is
unremarkable.
IMPRESSION: There is no pneumonia nor other acute cardiopulmonary abnormality.
